# Patient Record
Sex: Female | Born: 2005 | Race: Black or African American | Hispanic: No | Marital: Single | Smoking: Never smoker
Health system: Southern US, Community
[De-identification: ages and names within clinical notes are randomized; demographics above are authoritative.]

## PROBLEM LIST (undated history)

## (undated) DIAGNOSIS — J45909 Unspecified asthma, uncomplicated: Secondary | ICD-10-CM

## (undated) HISTORY — PX: TONSILLECTOMY: SUR1361

---
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
## 2024-02-12 ENCOUNTER — Ambulatory Visit: Payer: Self-pay | Admitting: Allergy
